# Patient Record
Sex: Male | Born: 1990 | Race: Black or African American | Hispanic: No | Marital: Single | Smoking: Former smoker
Health system: Southern US, Community
[De-identification: ages and names within clinical notes are randomized; demographics above are authoritative.]

## PROBLEM LIST (undated history)

## (undated) HISTORY — DX: Morbid (severe) obesity due to excess calories: E66.01

## (undated) HISTORY — PX: APPENDECTOMY: SHX54

---

## 2013-12-10 ENCOUNTER — Emergency Department (HOSPITAL_BASED_OUTPATIENT_CLINIC_OR_DEPARTMENT_OTHER)
Admission: EM | Admit: 2013-12-10 | Discharge: 2013-12-10 | Disposition: A | Discharge status: Home / Self Care | Payer: Self-pay | Attending: Emergency Medicine | Admitting: Emergency Medicine

## 2013-12-10 ENCOUNTER — Encounter (HOSPITAL_BASED_OUTPATIENT_CLINIC_OR_DEPARTMENT_OTHER): Payer: Self-pay | Admitting: Emergency Medicine

## 2013-12-10 DIAGNOSIS — R630 Anorexia: Secondary | ICD-10-CM | POA: Insufficient documentation

## 2013-12-10 DIAGNOSIS — J029 Acute pharyngitis, unspecified: Secondary | ICD-10-CM | POA: Insufficient documentation

## 2013-12-10 DIAGNOSIS — J111 Influenza due to unidentified influenza virus with other respiratory manifestations: Secondary | ICD-10-CM | POA: Insufficient documentation

## 2013-12-10 DIAGNOSIS — R05 Cough: Secondary | ICD-10-CM | POA: Insufficient documentation

## 2013-12-10 DIAGNOSIS — F17200 Nicotine dependence, unspecified, uncomplicated: Secondary | ICD-10-CM | POA: Insufficient documentation

## 2013-12-10 DIAGNOSIS — R6889 Other general symptoms and signs: Secondary | ICD-10-CM

## 2013-12-10 DIAGNOSIS — R079 Chest pain, unspecified: Secondary | ICD-10-CM | POA: Insufficient documentation

## 2013-12-10 DIAGNOSIS — IMO0001 Reserved for inherently not codable concepts without codable children: Secondary | ICD-10-CM | POA: Insufficient documentation

## 2013-12-10 DIAGNOSIS — R059 Cough, unspecified: Secondary | ICD-10-CM | POA: Insufficient documentation

## 2013-12-10 DIAGNOSIS — R509 Fever, unspecified: Secondary | ICD-10-CM | POA: Insufficient documentation

## 2013-12-10 MED ORDER — ACETAMINOPHEN 325 MG PO TABS
650.0000 mg | ORAL_TABLET | Freq: Once | ORAL | Status: AC
  Administered 2013-12-10: 650 mg via ORAL
  Filled 2013-12-10: qty 2

## 2013-12-10 NOTE — ED Provider Notes (Signed)
CSN: [id number]     Arrival date & time 12/10/13  2136 History   First MD Initiated Contact with Patient 12/10/13 2221     This chart was scribed for Shon Baton, MD by Ladona Ridgel Day, ED scribe. This patient was seen in room MH09/MH09 and the patient's care was started at 2221.  Chief Complaint  Patient presents with  . Fever   Patient is a 22 y.o. male presenting with cough. The history is provided by the patient. No language interpreter was used.  Cough Cough characteristics:  Harsh Severity:  Mild Onset quality:  Gradual Duration:  2 days Timing:  Constant Progression:  Worsening Chronicity:  New Context: sick contacts   Relieved by:  Nothing Worsened by:  Nothing tried Ineffective treatments:  None tried Associated symptoms: chest pain, fever and sore throat   Associated symptoms: no chills, no rash, no rhinorrhea and no shortness of breath    HPI Comments: Hunter Scott is a 22 y.o. male who presents to the Emergency Department complaining of constant, gradually worsened fever, myalgias and cough, onset 2 days ago. He also reports CP when coughing. He has not taken any medicines for this problem. He states girlfriend sick w/similar sx. He reports associated sore throat and decreased appetite. He denies SOB, abdominal pain, sore throat, nausea, emesis, diarrhea. He has no med hx. No allergies to medicines. Takes no regular medicines.  History reviewed. No pertinent past medical history. History reviewed. No pertinent past surgical history. History reviewed. No pertinent family history. History  Substance Use Topics  . Smoking status: Current Every Day Smoker  . Smokeless tobacco: Not on file  . Alcohol Use: Yes    Review of Systems  Constitutional: Positive for fever and appetite change. Negative for chills.  HENT: Positive for sore throat. Negative for congestion and rhinorrhea.   Respiratory: Positive for cough. Negative for shortness of breath.   Cardiovascular:  Positive for chest pain.  Gastrointestinal: Negative for nausea, vomiting, abdominal pain and diarrhea.  Musculoskeletal: Negative for back pain.  Skin: Negative for color change and rash.  Neurological: Negative for syncope.  All other systems reviewed and are negative.   A complete 10 system review of systems was obtained and all systems are negative except as noted in the HPI and PMH.   Allergies  Review of patient's allergies indicates no known allergies.  Home Medications  No current outpatient prescriptions on file. Triage Vitals: BP 145/66  Pulse 98  Temp(Src) 101.1 F (38.4 C) (Oral)  Resp 20  Ht 6\' 2"  (1.88 m)  Wt 210 lb (95.255 kg)  BMI 26.95 kg/m2  SpO2 98% Physical Exam  Nursing note and vitals reviewed. Constitutional: He is oriented to person, place, and time. No distress.  Ill-appearing but nontoxic  HENT:  Head: Normocephalic and atraumatic.  Mouth/Throat: Oropharynx is clear and moist.  Eyes: Pupils are equal, round, and reactive to light.  Neck: Neck supple.  Cardiovascular: Normal rate, regular rhythm and normal heart sounds.   No murmur heard. Pulmonary/Chest: Effort normal and breath sounds normal. No respiratory distress. He has no wheezes.  Abdominal: Soft. Bowel sounds are normal. There is no tenderness. There is no rebound.  Musculoskeletal: He exhibits no edema.  Lymphadenopathy:    He has no cervical adenopathy.  Neurological: He is alert and oriented to person, place, and time.  Skin: Skin is warm and dry. No rash noted.  Psychiatric: He has a normal mood and affect.    ED Course  Procedures (including critical care time) DIAGNOSTIC STUDIES: Oxygen Saturation is 98% on room air, normal by my interpretation.    COORDINATION OF CARE: At 1020 PM Discussed treatment plan with patient which includes tylenol. Patient agrees.   Labs Review Labs Reviewed - No data to display Imaging Review No results found.  EKG Interpretation   None        MDM   1. Fever   2. Flu-like symptoms     Patient presents with flulike symptoms. He is nontoxic-appearing on exam. Vital signs are notable for a fever to 101.1. Pulmonary exam is reassuring and have low suspicion for pneumonia at this time. Likely secondary to viral illness. Given duration of symptoms, even if this were the flu, patient would not be a candidate for Tamiflu. I have encouraged supportive care including Tylenol and Motrin as well as hydration. Patient stated understanding. At this time I do not feel he needs further workup.  After history, exam, and medical workup I feel the patient has been appropriately medically screened and is safe for discharge home. Pertinent diagnoses were discussed with the patient. Patient was given return precautions.   I personally performed the services described in this documentation, which was scribed in my presence. The recorded information has been reviewed and is accurate.      Shon Baton, MD 12/11/13 [phone]

## 2013-12-10 NOTE — ED Notes (Signed)
MD at bedside. 

## 2013-12-10 NOTE — ED Notes (Signed)
Patient with chills, body aches, and flu like symptoms since Monday.  Patient also has been coughing

## 2014-01-07 ENCOUNTER — Encounter (HOSPITAL_BASED_OUTPATIENT_CLINIC_OR_DEPARTMENT_OTHER): Payer: Self-pay | Admitting: Emergency Medicine

## 2014-01-07 ENCOUNTER — Emergency Department (HOSPITAL_BASED_OUTPATIENT_CLINIC_OR_DEPARTMENT_OTHER)
Admission: EM | Admit: 2014-01-07 | Discharge: 2014-01-07 | Disposition: A | Discharge status: Home / Self Care | Payer: Self-pay | Attending: Emergency Medicine | Admitting: Emergency Medicine

## 2014-01-07 DIAGNOSIS — F17200 Nicotine dependence, unspecified, uncomplicated: Secondary | ICD-10-CM | POA: Insufficient documentation

## 2014-01-07 DIAGNOSIS — J111 Influenza due to unidentified influenza virus with other respiratory manifestations: Secondary | ICD-10-CM | POA: Insufficient documentation

## 2014-01-07 DIAGNOSIS — J029 Acute pharyngitis, unspecified: Secondary | ICD-10-CM | POA: Insufficient documentation

## 2014-01-07 LAB — RAPID STREP SCREEN (MED CTR MEBANE ONLY): STREPTOCOCCUS, GROUP A SCREEN (DIRECT): POSITIVE — AB

## 2014-01-07 MED ORDER — HYDROCODONE-HOMATROPINE 5-1.5 MG/5ML PO SYRP: 5.0000 mL | ORAL_SOLUTION | Freq: Four times a day (QID) | ORAL | Status: AC | PRN

## 2014-01-07 NOTE — Discharge Instructions (Signed)
Influenza, Adult °Influenza (flu) is an infection in the mouth, nose, and throat (respiratory tract) caused by a virus. The flu can make you feel very ill. Influenza spreads easily from person to person (contagious).  °HOME CARE  °· Only take medicines as told by your doctor. °· Use a cool mist humidifier to make breathing easier. °· Get plenty of rest until your fever goes away. This usually takes 3 to 4 days. °· Drink enough fluids to keep your pee (urine) clear or pale yellow. °· Cover your mouth and nose when you cough or sneeze. °· Wash your hands well to avoid spreading the flu. °· Stay home from work or school until your fever has been gone for at least 1 full day. °· Get a flu shot every year. °GET HELP RIGHT AWAY IF:  °· You have trouble breathing or feel short of breath. °· Your skin or nails turn blue. °· You have severe neck pain or stiffness. °· You have a severe headache, facial pain, or earache. °· Your fever gets worse or keeps coming back. °· You feel sick to your stomach (nauseous), throw up (vomit), or have watery poop (diarrhea). °· You have chest pain. °· You have a deep cough that gets worse, or you cough up more thick spit (mucus). °MAKE SURE YOU:  °· Understand these instructions. °· Will watch your condition. °· Will get help right away if you are not doing well or get worse. °Document Released: 09/12/2008 Document Revised: 06/04/2012 Document Reviewed: 03/04/2012 °[employer]® Patient Information ©2014 [employer]. ° °

## 2014-01-07 NOTE — ED Provider Notes (Signed)
CSN: [id number]     Arrival date & time 01/07/14  0908 History   First MD Initiated Contact with Patient 01/07/14 [phone]     Chief Complaint  Patient presents with  . URI    sore throat, cough upper chest congestion    HPI Patient presents with fever cough and bodyaches since Friday.  Some congestion.  Slight sore throat.  No vomiting or diarrhea.  No previous past medical history. History reviewed. No pertinent past medical history. History reviewed. No pertinent past surgical history. History reviewed. No pertinent family history. History  Substance Use Topics  . Smoking status: Current Every Day Smoker  . Smokeless tobacco: Not on file  . Alcohol Use: Yes    Review of Systems All other systems reviewed and are negative Allergies  Review of patient's allergies indicates no known allergies.  Home Medications  No current outpatient prescriptions on file. BP 130/63  Pulse 102  Temp(Src) 100.3 F (37.9 C) (Oral)  Resp 18  SpO2 97% Physical Exam  Nursing note and vitals reviewed. Constitutional: He is oriented to person, place, and time. He appears well-developed and well-nourished. No distress.  HENT:  Head: Normocephalic and atraumatic.  Mouth/Throat: No oropharyngeal exudate.  Eyes: Pupils are equal, round, and reactive to light.  Neck: Normal range of motion.  Cardiovascular: Normal rate and intact distal pulses.   Pulmonary/Chest: No respiratory distress. He has no wheezes.  Abdominal: Normal appearance. He exhibits no distension. There is no tenderness. There is no rebound.  Musculoskeletal: Normal range of motion.  Neurological: He is alert and oriented to person, place, and time. No cranial nerve deficit.  Skin: Skin is warm and dry. No rash noted.  Psychiatric: He has a normal mood and affect. His behavior is normal.    ED Course  Procedures (including critical care time) Labs Review Labs Reviewed  RAPID STREP SCREEN   Imaging Review No results  found.    MDM   1. Influenza        Nelia Shiobert L Lyvonne Cassell, MD 01/07/14 [phone]

## 2014-01-07 NOTE — ED Notes (Signed)
Pt in with c/o cough, chest congestion, fever, aches, chills, and prior sore throat

## 2018-08-12 ENCOUNTER — Other Ambulatory Visit: Payer: Self-pay

## 2018-08-12 ENCOUNTER — Emergency Department (HOSPITAL_BASED_OUTPATIENT_CLINIC_OR_DEPARTMENT_OTHER)
Admission: EM | Admit: 2018-08-12 | Discharge: 2018-08-12 | Disposition: A | Discharge status: Home / Self Care | Payer: No Typology Code available for payment source | Attending: Emergency Medicine | Admitting: Emergency Medicine

## 2018-08-12 ENCOUNTER — Encounter (HOSPITAL_BASED_OUTPATIENT_CLINIC_OR_DEPARTMENT_OTHER): Payer: Self-pay | Admitting: *Deleted

## 2018-08-12 ENCOUNTER — Emergency Department (HOSPITAL_BASED_OUTPATIENT_CLINIC_OR_DEPARTMENT_OTHER): Payer: No Typology Code available for payment source

## 2018-08-12 DIAGNOSIS — S8001XA Contusion of right knee, initial encounter: Secondary | ICD-10-CM | POA: Diagnosis not present

## 2018-08-12 DIAGNOSIS — S30811A Abrasion of abdominal wall, initial encounter: Secondary | ICD-10-CM | POA: Insufficient documentation

## 2018-08-12 DIAGNOSIS — S161XXA Strain of muscle, fascia and tendon at neck level, initial encounter: Secondary | ICD-10-CM | POA: Diagnosis not present

## 2018-08-12 DIAGNOSIS — M545 Low back pain: Secondary | ICD-10-CM | POA: Diagnosis not present

## 2018-08-12 DIAGNOSIS — Y999 Unspecified external cause status: Secondary | ICD-10-CM | POA: Diagnosis not present

## 2018-08-12 DIAGNOSIS — Y9389 Activity, other specified: Secondary | ICD-10-CM | POA: Insufficient documentation

## 2018-08-12 DIAGNOSIS — Y92410 Unspecified street and highway as the place of occurrence of the external cause: Secondary | ICD-10-CM | POA: Insufficient documentation

## 2018-08-12 DIAGNOSIS — Z87891 Personal history of nicotine dependence: Secondary | ICD-10-CM | POA: Insufficient documentation

## 2018-08-12 DIAGNOSIS — M25562 Pain in left knee: Secondary | ICD-10-CM | POA: Diagnosis not present

## 2018-08-12 DIAGNOSIS — S199XXA Unspecified injury of neck, initial encounter: Secondary | ICD-10-CM | POA: Diagnosis present

## 2018-08-12 DIAGNOSIS — M79661 Pain in right lower leg: Secondary | ICD-10-CM | POA: Insufficient documentation

## 2018-08-12 MED ORDER — CYCLOBENZAPRINE HCL 10 MG PO TABS: 10.0000 mg | ORAL_TABLET | Freq: Two times a day (BID) | ORAL | 0 refills | Status: AC | PRN

## 2018-08-12 MED ORDER — IBUPROFEN 600 MG PO TABS: 600.0000 mg | ORAL_TABLET | Freq: Four times a day (QID) | ORAL | 0 refills | Status: DC | PRN
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
## 2018-08-12 NOTE — ED Notes (Signed)
ED Provider at bedside. 
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
## 2018-08-12 NOTE — ED Notes (Signed)
Patient transported to X-ray 

## 2018-09-21 ENCOUNTER — Encounter (INDEPENDENT_AMBULATORY_CARE_PROVIDER_SITE_OTHER): Payer: Self-pay
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
## 2019-09-29 ENCOUNTER — Other Ambulatory Visit: Payer: Self-pay

## 2019-09-29 ENCOUNTER — Encounter (HOSPITAL_BASED_OUTPATIENT_CLINIC_OR_DEPARTMENT_OTHER): Payer: Self-pay | Admitting: Emergency Medicine

## 2019-09-29 ENCOUNTER — Emergency Department (HOSPITAL_BASED_OUTPATIENT_CLINIC_OR_DEPARTMENT_OTHER)
Admission: EM | Admit: 2019-09-29 | Discharge: 2019-09-29 | Disposition: A | Discharge status: Home / Self Care | Payer: Self-pay | Attending: Emergency Medicine | Admitting: Emergency Medicine

## 2019-09-29 DIAGNOSIS — K029 Dental caries, unspecified: Secondary | ICD-10-CM | POA: Insufficient documentation

## 2019-09-29 DIAGNOSIS — F17200 Nicotine dependence, unspecified, uncomplicated: Secondary | ICD-10-CM | POA: Insufficient documentation

## 2019-09-29 DIAGNOSIS — K0889 Other specified disorders of teeth and supporting structures: Secondary | ICD-10-CM

## 2019-09-29 MED ORDER — IBUPROFEN 600 MG PO TABS: 600.0000 mg | ORAL_TABLET | Freq: Four times a day (QID) | ORAL | 0 refills | Status: DC | PRN

## 2019-09-29 MED ORDER — PENICILLIN V POTASSIUM 500 MG PO TABS: 500.0000 mg | ORAL_TABLET | Freq: Four times a day (QID) | ORAL | 0 refills | Status: AC

## 2019-09-29 MED FILL — PENICILLIN VK 500 MG TABLET: 500 | 7 days supply | Qty: 28 | Fill #0

## 2019-09-29 MED FILL — IBUPROFEN 600 MG TABLET: 600 | 7 days supply | Qty: 30 | Fill #0

## 2019-09-29 NOTE — Discharge Instructions (Signed)
Please keep appointment with dentist as scheduled for Thursday  Attached is a resource guide for other dentists in the area as well  Please take antibiotics as prescribed. Take Ibuprofen as prescribed as well. Do not exceed daily recommended dose of any OTC medications

## 2019-09-29 NOTE — ED Provider Notes (Signed)
[facility] EMERGENC[facility] DEPARTMENT Provider Note   CSN: [id number] Arrival date & time: 09/29/19  3235     History   Chief Complaint Chief Complaint  Patient presents with  . Dental Pain    HPI [facility] is a 28 y.o. male who presents to the ED today complaining of gradual onset, constant, achy, 10/10, right upper dental pain x 1 week. Pt reports he has been using OTC medications as well as applying oragel without relief. He states he has an appointment on Thursday for a dentist but came to the ED due to continued pain. Denies fever, chills, ear pain, facial swelling, drainage from tooth/foul taste in mouth, sore throat, trismus, inability to swallow, or any other associated symptoms.       History reviewed. No pertinent past medical history.  There are no active problems to display for this patient.   History reviewed. No pertinent surgical history.      Home Medications    Prior to Admission medications   Medication Sig Start Date End Date Taking? Authorizing Provider  cyclobenzaprine (FLEXERIL) 10 MG tablet Take 1 tablet (10 mg total) by mouth 2 (two) times daily as needed for muscle spasms. 08/12/18   McDonald, Mia A, PA-C  HYDROcodone-homatropine (HYCODAN) 5-1.5 MG/5ML syrup Take 5 mLs by mouth every 6 (six) hours as needed for cough. 01/07/14   Leonard Schwartz, MD  ibuprofen (ADVIL) 600 MG tablet Take 1 tablet (600 mg total) by mouth every 6 (six) hours as needed. 09/29/19   Alroy Bailiff, Ajna Moors, PA-C  ibuprofen (ADVIL,MOTRIN) 600 MG tablet Take 1 tablet (600 mg total) by mouth every 6 (six) hours as needed. 08/12/18   McDonald, Mia A, PA-C  penicillin v potassium (VEETID) 500 MG tablet Take 1 tablet (500 mg total) by mouth 4 (four) times daily for 7 days. 09/29/19 10/06/19  Eustaquio Maize, PA-C    Family History No family history on file.  Social History Social History   Tobacco Use  . Smoking status: Current Every Day Smoker  . Smokeless tobacco: Never  Used  . Tobacco comment: marijuana  Substance Use Topics  . Alcohol use: Yes  . Drug use: Yes    Types: Marijuana     Allergies   Patient has no known allergies.   Review of Systems Review of Systems  Constitutional: Negative for chills and fever.  HENT: Positive for dental problem. Negative for drooling, ear pain and facial swelling.      Physical Exam Updated Vital Signs BP 125/78   Pulse (!) 54   Temp 98.1 F (36.7 C) (Oral)   Resp 15   Ht 6\' 3"  (1.905 m)   Wt 104.3 kg   SpO2 97%   BMI 28.75 kg/m   Physical Exam Vitals signs and nursing note reviewed.  Constitutional:      Appearance: He is not ill-appearing or diaphoretic.  HENT:     Head: Normocephalic and atraumatic.     Right Ear: Tympanic membrane normal.     Left Ear: Tympanic membrane normal.     Mouth/Throat:     Comments: Nose clear.  R upper tooth #7 decayedwith caries with TTP, with minimal surrounding gingival swelling and erythema, no definite abscess, no evidence of ludwig's.  Oropharynx clear and moist, without uvular swelling or deviation, no trismus or drooling, no tonsillar swelling or erythema, no exudates.   Eyes:     Conjunctiva/sclera: Conjunctivae normal.  Neck:     Musculoskeletal: Normal range of motion.  Cardiovascular:     Rate and Rhythm: Normal rate and regular rhythm.     Pulses: Normal pulses.  Pulmonary:     Effort: Pulmonary effort is normal.     Breath sounds: Normal breath sounds. No wheezing, rhonchi or rales.  Lymphadenopathy:     Cervical: No cervical adenopathy.  Skin:    General: Skin is warm and dry.     Coloration: Skin is not jaundiced.  Neurological:     Mental Status: He is alert.      ED Treatments / Results  Labs (all labs ordered are listed, but only abnormal results are displayed) Labs Reviewed - No data to display  EKG None  Radiology No results found.  Procedures Procedures (including critical care time)  Medications Ordered in ED  Medications - No data to display   Initial Impression / Assessment and Plan / ED Course  I have reviewed the triage vital signs and the nursing notes.  Pertinent labs & imaging results that were available during my care of the patient were reviewed by me and considered in my medical decision making (see chart for details).    28 year old male presents to the ED with right upper dental pain x 1 week.  He states he has been taking over-the-counter medications without relief.  Exam he has dental caries throughout.  No obvious dental abscess to be drained today.  No Ludwig's angina.  No signs stable.  Patient afebrile without tachycardia or tachypnea.  Will treat with antibiotics today as well Ibuprofen.  She has an appointment with a dentist on Thursday.  I advised that he keep this for further evaluation.  Return precautions have been discussed.  Patient is in agreement with plan at this time and stable for discharge.   This note was prepared using Dragon voice recognition software and may include unintentional dictation errors due to the inherent limitations of voice recognition software.    Final Clinical Impressions(s) / ED Diagnoses   Final diagnoses:  Pain, dental  Dental caries    ED Discharge Orders         Ordered    penicillin v potassium (VEETID) 500 MG tablet  4 times daily     09/29/19 1050    ibuprofen (ADVIL) 600 MG tablet  Every 6 hours PRN     09/29/19 1050           Tanda Rockers, PA-C 09/29/19 1053    Gwyneth Sprout, MD 09/29/19 2157

## 2019-09-29 NOTE — ED Triage Notes (Signed)
Pt c/o pain to RT upper gum area x 1 wk

## 2019-10-09 ENCOUNTER — Other Ambulatory Visit: Payer: Self-pay

## 2019-10-09 DIAGNOSIS — Z20822 Contact with and (suspected) exposure to covid-19: Secondary | ICD-10-CM

## 2019-10-11 LAB — NOVEL CORONAVIRUS, NAA: SARS-CoV-2, NAA: NOT DETECTED

## 2020-01-31 ENCOUNTER — Other Ambulatory Visit: Payer: Self-pay

## 2020-01-31 ENCOUNTER — Emergency Department (HOSPITAL_BASED_OUTPATIENT_CLINIC_OR_DEPARTMENT_OTHER)
Admission: EM | Admit: 2020-01-31 | Discharge: 2020-01-31 | Disposition: A | Discharge status: Home / Self Care | Payer: HRSA Program | Attending: Emergency Medicine | Admitting: Emergency Medicine

## 2020-01-31 ENCOUNTER — Encounter (HOSPITAL_BASED_OUTPATIENT_CLINIC_OR_DEPARTMENT_OTHER): Payer: Self-pay | Admitting: Emergency Medicine

## 2020-01-31 ENCOUNTER — Emergency Department (HOSPITAL_BASED_OUTPATIENT_CLINIC_OR_DEPARTMENT_OTHER): Payer: HRSA Program

## 2020-01-31 DIAGNOSIS — R059 Cough, unspecified: Secondary | ICD-10-CM

## 2020-01-31 DIAGNOSIS — U071 COVID-19: Secondary | ICD-10-CM | POA: Insufficient documentation

## 2020-01-31 DIAGNOSIS — R05 Cough: Secondary | ICD-10-CM | POA: Diagnosis present

## 2020-01-31 DIAGNOSIS — F17210 Nicotine dependence, cigarettes, uncomplicated: Secondary | ICD-10-CM | POA: Diagnosis not present

## 2020-01-31 MED ORDER — IBUPROFEN 600 MG PO TABS
600.0000 mg | ORAL_TABLET | Freq: Four times a day (QID) | ORAL | 0 refills | Status: AC | PRN
Start: 2020-01-31 — End: ?

## 2020-01-31 MED ORDER — BENZONATATE 100 MG PO CAPS: 100.0000 mg | ORAL_CAPSULE | Freq: Three times a day (TID) | ORAL | 0 refills | Status: AC

## 2020-01-31 NOTE — ED Triage Notes (Signed)
Pt c/o fever that pt reports started last night. Pt also reports cough, and sinus congestion and itchy throat. Pt denies sob

## 2020-01-31 NOTE — ED Notes (Signed)
Portable Xray at bedside.

## 2020-01-31 NOTE — Discharge Instructions (Addendum)
You were seen in the emergency department today for a cough.  Your chest x-ray was normal.  We suspect this is related to a virus.  We are sending you home with ibuprofen to take every 6 hours as needed for pain or fevers, be sure to take this with food as it can cause stomach upset and or stomach bleeding.  We are also sending you with Tessalon take every hour as needed for coughing.  We have prescribed you new medication(s) today. Discuss the medications prescribed today with your pharmacist as they can have adverse effects and interactions with your other medicines including over the counter and prescribed medications. Seek medical evaluation if you start to experience new or abnormal symptoms after taking one of these medicines, seek care immediately if you start to experience difficulty breathing, feeling of your throat closing, facial swelling, or rash as these could be indications of a more serious allergic reaction  We have tested you for COVID 19, we will call you within the next 72 hours if results are positive, you may also view these results on MyChart.   We are instructing patient's with COVID 19 or symptoms of COVID 19 to quarantine themselves for 14 days. You may be able to discontinue self quarantine if the following conditions are met:   Persons with COVID-19 who have symptoms and were directed to care for themselves at home may discontinue home isolation under the  following conditions: - It has been at least 7 days have passed since symptoms first appeared. - AND at least 3 days (72 hours) have passed since recovery defined as resolution of fever without the use of fever-reducing medications and improvement in respiratory symptoms (e.g., cough, shortness of breath)  Please follow the below quarantine instructions.   Please follow up with primary care within 3-5 days for re-evaluation- call prior to going to the office to make them aware of your symptoms as some offices are altering  their method of seeing patients with COVID 19 symptoms. Return to the ER for new or worsening symptoms including but not limited to increased work of breathing, chest pain, passing out, or any other concerns.       Person Under Monitoring Name: [facility]  Location: [address] [place] [address]   Infection Prevention Recommendations for Individuals Confirmed to have, or Being Evaluated for, 2019 Novel Coronavirus (COVID-19) Infection Who Receive Care at Home  Individuals who are confirmed to have, or are being evaluated for, COVID-19 should follow the prevention steps below until a healthcare provider or local or state health department says they can return to normal activities.  Stay home except to get medical care You should restrict activities outside your home, except for getting medical care. Do not go to work, school, or public areas, and do not use public transportation or taxis.  Call ahead before visiting your doctor Before your medical appointment, call the healthcare provider and tell them that you have, or are being evaluated for, COVID-19 infection. This will help the healthcare provider's office take steps to keep other people from getting infected. Ask your healthcare provider to call the local or state health department.  Monitor your symptoms Seek prompt medical attention if your illness is worsening (e.g., difficulty breathing). Before going to your medical appointment, call the healthcare provider and tell them that you have, or are being evaluated for, COVID-19 infection. Ask your healthcare provider to call the local or state health department.  Wear a facemask  You should wear a facemask that covers your nose and mouth when you are in the same room with other people and when you visit a healthcare provider. People who live with or visit you should also wear a facemask while they are in the same room with you.  Separate yourself from other people  in your home As much as possible, you should stay in a different room from other people in your home. Also, you should use a separate bathroom, if available.  Avoid sharing household items You should not share dishes, drinking glasses, cups, eating utensils, towels, bedding, or other items with other people in your home. After using these items, you should wash them thoroughly with soap and water.  Cover your coughs and sneezes Cover your mouth and nose with a tissue when you cough or sneeze, or you can cough or sneeze into your sleeve. Throw used tissues in a lined trash can, and immediately wash your hands with soap and water for at least 20 seconds or use an alcohol-based hand rub.  Wash your [facility] your hands often and thoroughly with soap and water for at least 20 seconds. You can use an alcohol-based hand sanitizer if soap and water are not available and if your hands are not visibly dirty. Avoid touching your eyes, nose, and mouth with unwashed hands.   Prevention Steps for Caregivers and Household Members of Individuals Confirmed to have, or Being Evaluated for, COVID-19 Infection Being Cared for in the Home  If you live with, or provide care at home for, a person confirmed to have, or being evaluated for, COVID-19 infection please follow these guidelines to prevent infection:  Follow healthcare provider's instructions Make sure that you understand and can help the patient follow any healthcare provider instructions for all care.  Provide for the patient's basic needs You should help the patient with basic needs in the home and provide support for getting groceries, prescriptions, and other personal needs.  Monitor the patient's symptoms If they are getting sicker, call his or her medical provider and tell them that the patient has, or is being evaluated for, COVID-19 infection. This will help the healthcare provider's office take steps to keep other people from getting  infected. Ask the healthcare provider to call the local or state health department.  Limit the number of people who have contact with the patient If possible, have only one caregiver for the patient. Other household members should stay in another home or place of residence. If this is not possible, they should stay in another room, or be separated from the patient as much as possible. Use a separate bathroom, if available. Restrict visitors who do not have an essential need to be in the home.  Keep older adults, very young children, and other sick people away from the patient Keep older adults, very young children, and those who have compromised immune systems or chronic health conditions away from the patient. This includes people with chronic heart, lung, or kidney conditions, diabetes, and cancer.  Ensure good ventilation Make sure that shared spaces in the home have good air flow, such as from an air conditioner or an opened window, weather permitting.  Wash your hands often Wash your hands often and thoroughly with soap and water for at least 20 seconds. You can use an alcohol based hand sanitizer if soap and water are not available and if your hands are not visibly dirty. Avoid touching your eyes, nose, and mouth with unwashed  hands. Use disposable paper towels to dry your hands. If not available, use dedicated cloth towels and replace them when they become wet.  Wear a facemask and gloves Wear a disposable facemask at all times in the room and gloves when you touch or have contact with the patient's blood, body fluids, and/or secretions or excretions, such as sweat, saliva, sputum, nasal mucus, vomit, urine, or feces.  Ensure the mask fits over your nose and mouth tightly, and do not touch it during use. Throw out disposable facemasks and gloves after using them. Do not reuse. Wash your hands immediately after removing your facemask and gloves. If your personal clothing becomes  contaminated, carefully remove clothing and launder. Wash your hands after handling contaminated clothing. Place all used disposable facemasks, gloves, and other waste in a lined container before disposing them with other household waste. Remove gloves and wash your hands immediately after handling these items.  Do not share dishes, glasses, or other household items with the patient Avoid sharing household items. You should not share dishes, drinking glasses, cups, eating utensils, towels, bedding, or other items with a patient who is confirmed to have, or being evaluated for, COVID-19 infection. After the person uses these items, you should wash them thoroughly with soap and water.  Wash laundry thoroughly Immediately remove and wash clothes or bedding that have blood, body fluids, and/or secretions or excretions, such as sweat, saliva, sputum, nasal mucus, vomit, urine, or feces, on them. Wear gloves when handling laundry from the patient. Read and follow directions on labels of laundry or clothing items and detergent. In general, wash and dry with the warmest temperatures recommended on the label.  Clean all areas the individual has used often Clean all touchable surfaces, such as counters, tabletops, doorknobs, bathroom fixtures, toilets, phones, keyboards, tablets, and bedside tables, every day. Also, clean any surfaces that may have blood, body fluids, and/or secretions or excretions on them. Wear gloves when cleaning surfaces the patient has come in contact with. Use a diluted bleach solution (e.g., dilute bleach with 1 part bleach and 10 parts water) or a household disinfectant with a label that says EPA-registered for coronaviruses. To make a bleach solution at home, add 1 tablespoon of bleach to 1 quart (4 cups) of water. For a larger supply, add  cup of bleach to 1 gallon (16 cups) of water. Read labels of cleaning products and follow recommendations provided on product labels. Labels  contain instructions for safe and effective use of the cleaning product including precautions you should take when applying the product, such as wearing gloves or eye protection and making sure you have good ventilation during use of the product. Remove gloves and wash hands immediately after cleaning.  Monitor yourself for signs and symptoms of illness Caregivers and household members are considered close contacts, should monitor their health, and will be asked to limit movement outside of the home to the extent possible. Follow the monitoring steps for close contacts listed on the symptom monitoring form.   ? If you have additional questions, contact your local health department or call the epidemiologist on call at [phone] (available 24/7). ? This guidance is subject to change. For the most up-to-date guidance from [facility], please refer to their website: [email or web address]

## 2020-01-31 NOTE — ED Provider Notes (Signed)
[facility] Provider [facility]   CSN: [id number] Arrival date & time: 01/31/20  1337     History Chief Complaint  Patient presents with  . Cough    [facility] is a 29 y.o. male with a history of tobacco abuse & prior appendectomy who presents to the ED with complaints of dry cough with subjective fever and chills since last night.  No alleviating or aggravating factors.  No intervention prior to arrival.  No recent sick contacts with similar symptoms.  No known exposures to COVID-19.  Denies nasal congestion, ear pain, sore throat, neck stiffness, shortness of breath, chest pain, abdominal pain, or N/V/D.   HPI     History reviewed. No pertinent past medical history.  There are no problems to display for this patient.   Past Surgical History:  Procedure Laterality Date  . APPENDECTOMY         History reviewed. No pertinent family history.  Social History   Tobacco Use  . Smoking status: Current Every Day Smoker  . Smokeless tobacco: Never Used  . Tobacco comment: marijuana  Substance Use Topics  . Alcohol use: Not Currently  . Drug use: Yes    Types: Marijuana    Home Medications Prior to Admission medications   Medication Sig Start Date End Date Taking? Authorizing Provider  cyclobenzaprine (FLEXERIL) 10 MG tablet Take 1 tablet (10 mg total) by mouth 2 (two) times daily as needed for muscle spasms. 08/12/18   McDonald, Mia A, PA-C  HYDROcodone-homatropine (HYCODAN) 5-1.5 MG/5ML syrup Take 5 mLs by mouth every 6 (six) hours as needed for cough. 01/07/14   Leonard Schwartz, MD  ibuprofen (ADVIL) 600 MG tablet Take 1 tablet (600 mg total) by mouth every 6 (six) hours as needed. 09/29/19   Alroy Bailiff, Margaux, PA-C  ibuprofen (ADVIL,MOTRIN) 600 MG tablet Take 1 tablet (600 mg total) by mouth every 6 (six) hours as needed. 08/12/18   McDonald, Mia A, PA-C    Allergies    Patient has no known allergies.  Review of Systems   Review of Systems    Constitutional: Positive for chills and fever (subjective).  HENT: Negative for congestion, ear pain and sore throat.   Respiratory: Positive for cough. Negative for shortness of breath.   Cardiovascular: Negative for chest pain.  Gastrointestinal: Negative for abdominal pain, diarrhea, nausea and vomiting.  Genitourinary: Negative for dysuria.  Musculoskeletal: Negative for arthralgias and myalgias.    Physical Exam Updated Vital Signs BP (!) 115/99 (BP Location: Right Arm)   Pulse 94   Temp 98.2 F (36.8 C) (Oral)   Resp 20   Ht 6\' 3"  (1.905 m)   Wt 131.5 kg   SpO2 99%   BMI 36.25 kg/m   Physical Exam Vitals and nursing note reviewed.  Constitutional:      General: He is not in acute distress.    Appearance: He is well-developed. He is not toxic-appearing.  HENT:     Head: Normocephalic and atraumatic.     Right Ear: Ear canal normal. Tympanic membrane is not perforated, erythematous, retracted or bulging.     Left Ear: Ear canal normal. Tympanic membrane is not perforated, erythematous, retracted or bulging.     Ears:     Comments: No mastoid erythema/swellng/tenderness.     Nose:     Right Sinus: No maxillary sinus tenderness or frontal sinus tenderness.     Left Sinus: No maxillary sinus tenderness or frontal sinus tenderness.  Mouth/Throat:     Pharynx: Oropharynx is clear. Uvula midline. No oropharyngeal exudate or posterior oropharyngeal erythema.     Comments: Posterior oropharynx is symmetric appearing. Patient tolerating own secretions without difficulty. No trismus. No drooling. No hot potato voice. No swelling beneath the tongue, submandibular compartment is soft.  Eyes:     General:        Right eye: No discharge.        Left eye: No discharge.     Conjunctiva/sclera: Conjunctivae normal.  Cardiovascular:     Rate and Rhythm: Normal rate and regular rhythm.  Pulmonary:     Effort: Pulmonary effort is normal. No respiratory distress.     Breath  sounds: Normal breath sounds. No wheezing, rhonchi or rales.  Abdominal:     General: There is no distension.     Palpations: Abdomen is soft.     Tenderness: There is no abdominal tenderness. There is no guarding or rebound.  Musculoskeletal:     Cervical back: Neck supple. No rigidity.  Lymphadenopathy:     Cervical: No cervical adenopathy.  Skin:    General: Skin is warm and dry.     Findings: No rash.  Neurological:     Mental Status: He is alert.  Psychiatric:        Behavior: Behavior normal.     ED Results / Procedures / Treatments   Labs (all labs ordered are listed, but only abnormal results are displayed) Labs Reviewed - No data to display  EKG None  Radiology No results found.  Procedures Procedures (including critical care time)  Medications Ordered in ED Medications - No data to display  ED Course  I have reviewed the triage vital signs and the nursing notes.  Pertinent labs & imaging results that were available during my care of the patient were reviewed by me and considered in my medical decision making (see chart for details).    [facility] was evaluated in [facility] on 01/31/2020 for the symptoms described in the history of present illness. He/she was evaluated in the context of the global COVID-19 pandemic, which necessitated consideration that the patient might be at risk for infection with the SARS-CoV-2 virus that causes COVID-19. Institutional protocols and algorithms that pertain to the evaluation of patients at risk for COVID-19 are in a state of rapid change based on information released by regulatory bodies including the CDC and federal and state organizations. These policies and algorithms were followed during the patient's care in the ED.  MDM Rules/Calculators/A&P                      Patient presents with cough & subjective fever with chills. Patient is nontoxic appearing, in no apparent distress, vitals without significant  abnormality. Patient is afebrile in the ED, lungs are CTA, CXR  negative for infiltrate, doubt acute community acquired pneumonia. There is no wheezing or signs of respiratory distress. Sxs onset < 7 days, afebrile here, no sinus tenderness, doubt acute bacterial sinusitis. Centor score 0, doubt strep pharyngitis. No evidence of AOM on exam. No meningeal signs. Suspect viral etiology, will test for covid, discussed influenza testing- patient declined and would not want tamiflu therefore this was deferred, discussed quarantine, will treat supportively with ibuprofen & Tessalon. I discussed results, treatment plan, need for PCP follow-up, and return precautions with the patient. Provided opportunity for questions, patient confirmed understanding and is in agreement with plan.    Final Clinical Impression(s) /  ED Diagnoses Final diagnoses:  Cough    Rx / DC Orders ED Discharge Orders         Ordered    ibuprofen (ADVIL) 600 MG tablet  Every 6 hours PRN     01/31/20 1500    benzonatate (TESSALON) 100 MG capsule  Every 8 hours     01/31/20 1500           Manvir Thorson, Pleas Koch, PA-C 01/31/20 1501    Raeford Razor, MD 02/01/20 [phone]

## 2020-02-01 LAB — SARS CORONAVIRUS 2 (TAT 6-24 HRS): SARS Coronavirus 2: POSITIVE — AB

## 2020-02-02 ENCOUNTER — Telehealth: Payer: Self-pay | Admitting: *Deleted

## 2020-02-02 ENCOUNTER — Encounter: Payer: Self-pay | Admitting: Physician Assistant

## 2020-02-02 ENCOUNTER — Telehealth: Payer: Self-pay | Admitting: Physician Assistant

## 2020-02-02 ENCOUNTER — Telehealth (HOSPITAL_COMMUNITY): Payer: Self-pay

## 2020-02-02 ENCOUNTER — Telehealth: Payer: Self-pay

## 2020-02-02 ENCOUNTER — Telehealth: Payer: Self-pay | Admitting: Nurse Practitioner

## 2020-02-02 NOTE — Telephone Encounter (Signed)
Pt called to get assistance with his MyChart and how to get a copy of his test results. Pt advised, he voiced understanding.

## 2020-02-02 NOTE — Telephone Encounter (Signed)
Called to Discuss with patient about Covid symptoms and the use of bamlanivimab, a monoclonal antibody infusion for those with mild to moderate Covid symptoms and at a high risk of hospitalization.     Pt is qualified for this infusion at the [facility] due to co-morbid conditions and/or a member of an at-risk group.     Unable to reach pt  

## 2020-02-02 NOTE — Telephone Encounter (Signed)
Returned pt.'s call. Given COVID 19 results, verbalizes understanding. Will quarantine x 10 days from beginning of symptoms. Reviewed homes safety - will isolate from other family members. Wear a mask when around others. Good handwashing, wipe down shared surfaces in your home. Given contact number for antibody infusion information. Health dept. Notified.

## 2020-02-02 NOTE — Telephone Encounter (Signed)
Called to discuss with patient about Covid symptoms and the use of bamlanivimab or casirivimab/imdevimab, a monoclonal antibody infusion for those with mild to moderate Covid symptoms and at a high risk of hospitalization.  Pt is qualified for this infusion at the [facility] infusion center due to BMI>35 and immuno-compromised   Message left to call back  Cline Crock PA-C  [facility]

## 2020-02-02 NOTE — Telephone Encounter (Signed)
Pt called to get covid test results. Will have nurse call him back. ° °Hunter Scott ° ° ° °

## 2020-02-03 ENCOUNTER — Telehealth: Payer: Self-pay | Admitting: Physician Assistant

## 2020-02-03 ENCOUNTER — Telehealth (HOSPITAL_COMMUNITY): Payer: Self-pay

## 2020-02-03 NOTE — Telephone Encounter (Signed)
02/03/2020  29 year old male reached via telephone today.  There have been multiple attempts from our team to build to reach the patient.  Patient was finally reached by telephone today.  He reported that he had symptom onset on 01/30/2020.  He tested positive for Covid on 01/31/2020.  Assessment: 01/30/2020 - symptom onset - cough  01/30/2020 - fever started 01/31/2020 - ED eval  01/31/2020 - SarsCOV2 - positive   Patient would qualify for the monoclonal antibody infusion based off his BMI  Plan: Patient declines monoclonal antibody infusion at this time Patient declined the telephone number for follow-up with our office if he changes his mind  Elisha Headland, FNP

## 2020-02-03 NOTE — Telephone Encounter (Signed)
Called to discuss with Youlanda Roys about Covid symptoms and the use of bamlanivimab, a monoclonal antibody infusion for those with mild to moderate Covid symptoms and at a high risk of hospitalization.     Pt is qualified for this infusion at the [facility] infusion center due to co-morbid conditions and/or a member of an at-risk group.  Unable to reach pt over the phone despite multiple calls.

## 2020-12-23 ENCOUNTER — Emergency Department (HOSPITAL_BASED_OUTPATIENT_CLINIC_OR_DEPARTMENT_OTHER)
Admission: EM | Admit: 2020-12-23 | Discharge: 2020-12-23 | Disposition: A | Discharge status: Home / Self Care | Payer: HRSA Program | Attending: Emergency Medicine | Admitting: Emergency Medicine

## 2020-12-23 ENCOUNTER — Encounter (HOSPITAL_BASED_OUTPATIENT_CLINIC_OR_DEPARTMENT_OTHER): Payer: Self-pay | Admitting: *Deleted

## 2020-12-23 ENCOUNTER — Other Ambulatory Visit: Payer: Self-pay

## 2020-12-23 DIAGNOSIS — F17200 Nicotine dependence, unspecified, uncomplicated: Secondary | ICD-10-CM | POA: Diagnosis not present

## 2020-12-23 DIAGNOSIS — Z20822 Contact with and (suspected) exposure to covid-19: Secondary | ICD-10-CM

## 2020-12-23 DIAGNOSIS — B349 Viral infection, unspecified: Secondary | ICD-10-CM

## 2020-12-23 DIAGNOSIS — U071 COVID-19: Secondary | ICD-10-CM | POA: Diagnosis not present

## 2020-12-23 DIAGNOSIS — R509 Fever, unspecified: Secondary | ICD-10-CM | POA: Diagnosis present

## 2020-12-23 MED ORDER — ONDANSETRON 4 MG PO TBDP
4.0000 mg | ORAL_TABLET | Freq: Once | ORAL | Status: AC
  Administered 2020-12-23: 4 mg via ORAL
  Filled 2020-12-23: qty 1

## 2020-12-23 MED ORDER — ONDANSETRON 4 MG PO TBDP: 4.0000 mg | ORAL_TABLET | Freq: Three times a day (TID) | ORAL | 0 refills | Status: AC | PRN

## 2020-12-23 MED ORDER — ACETAMINOPHEN 325 MG PO TABS
650.0000 mg | ORAL_TABLET | Freq: Once | ORAL | Status: AC
  Administered 2020-12-23: 650 mg via ORAL
  Filled 2020-12-23: qty 2

## 2020-12-23 NOTE — ED Provider Notes (Signed)
[facility] EMERGENCY DEPARTMENT Provider Note   CSN: [id number] Arrival date & time: 12/23/20  1339     History Chief Complaint  Patient presents with  . Fever    Hunter Scott is a 30 y.o. male.  HPI 30 year old male with history of morbid obesity presents to the ER with complaints of chills, body aches, nausea and vomiting since about 3 AM.  He is not vaccinated for Covid.  No abdominal pain.  Has also lost his smell.  Overall feels very weak.  No diarrhea.  Does not think he had any bad food.  No cough, nasal congestion.  No shortness of breath or chest pain.  No back pain.    Past Medical History:  Diagnosis Date  . Morbid obesity (HCC)     There are no problems to display for this patient.   Past Surgical History:  Procedure Laterality Date  . APPENDECTOMY         No family history on file.  Social History   Tobacco Use  . Smoking status: Current Every Day Smoker  . Smokeless tobacco: Never Used  . Tobacco comment: marijuana  Vaping Use  . Vaping Use: Never used  Substance Use Topics  . Alcohol use: Not Currently  . Drug use: Yes    Types: Marijuana    Home Medications Prior to Admission medications   Medication Sig Start Date End Date Taking? Authorizing Provider  ondansetron (ZOFRAN ODT) 4 MG disintegrating tablet Take 1 tablet (4 mg total) by mouth every 8 (eight) hours as needed for nausea or vomiting. 12/23/20  Yes Mare Ferrari, PA-C  benzonatate (TESSALON) 100 MG capsule Take 1 capsule (100 mg total) by mouth every 8 (eight) hours. 01/31/20   Petrucelli, Samantha R, PA-C  cyclobenzaprine (FLEXERIL) 10 MG tablet Take 1 tablet (10 mg total) by mouth 2 (two) times daily as needed for muscle spasms. 08/12/18   McDonald, Mia A, PA-C  HYDROcodone-homatropine (HYCODAN) 5-1.5 MG/5ML syrup Take 5 mLs by mouth every 6 (six) hours as needed for cough. 01/07/14   Nelva Nay, MD  ibuprofen (ADVIL) 600 MG tablet Take 1 tablet (600 mg total) by mouth  every 6 (six) hours as needed for fever or mild pain. 01/31/20   Petrucelli, Pleas Koch, PA-C    Allergies    Patient has no known allergies.  Review of Systems   Review of Systems  Constitutional: Positive for chills. Negative for fever.  HENT: Negative for ear pain and sore throat.   Eyes: Negative for pain and visual disturbance.  Respiratory: Negative for cough and shortness of breath.   Cardiovascular: Negative for chest pain and palpitations.  Gastrointestinal: Positive for nausea and vomiting. Negative for abdominal pain.  Genitourinary: Negative for dysuria and hematuria.  Musculoskeletal: Negative for arthralgias and back pain.  Skin: Negative for color change and rash.  Neurological: Positive for weakness. Negative for seizures and syncope.  All other systems reviewed and are negative.   Physical Exam Updated Vital Signs BP (!) 111/55 (BP Location: Right Arm)   Pulse 90   Temp 100.3 F (37.9 C) (Oral)   Resp 16   Ht 6\' 3"  (1.905 m)   Wt 131.5 kg   SpO2 98%   BMI 36.25 kg/m   Physical Exam Constitutional:      General: He is not in acute distress.    Appearance: Normal appearance. He is not ill-appearing, toxic-appearing or diaphoretic.     Comments: Clinically well-appearing, nontoxic, no acute  distress  HENT:     Head: Normocephalic and atraumatic.  Eyes:     Extraocular Movements: Extraocular movements intact.     Conjunctiva/sclera: Conjunctivae normal.     Pupils: Pupils are equal, round, and reactive to light.  Cardiovascular:     Rate and Rhythm: Normal rate and regular rhythm.     Pulses: Normal pulses.     Heart sounds: Normal heart sounds.  Pulmonary:     Effort: Pulmonary effort is normal.     Breath sounds: Normal breath sounds.  Abdominal:     General: Abdomen is flat. Bowel sounds are normal. There is no distension.     Palpations: Abdomen is soft. There is no mass.     Tenderness: There is no abdominal tenderness. There is no right CVA  tenderness, left CVA tenderness, guarding or rebound.     Comments: Abdomen soft nontender, no flank tenderness  Musculoskeletal:        General: Normal range of motion.     Cervical back: Normal range of motion. No tenderness.  Skin:    General: Skin is warm and dry.  Neurological:     General: No focal deficit present.     Mental Status: He is alert and oriented to person, place, and time.  Psychiatric:        Mood and Affect: Mood normal.        Behavior: Behavior normal.     ED Results / Procedures / Treatments   Labs (all labs ordered are listed, but only abnormal results are displayed) Labs Reviewed  SARS CORONAVIRUS 2 (TAT 6-24 HRS)    EKG None  Radiology No results found.  Procedures Procedures (including critical care time)  Medications Ordered in ED Medications  acetaminophen (TYLENOL) tablet 650 mg (650 mg Oral Given 12/23/20 1720)  ondansetron (ZOFRAN-ODT) disintegrating tablet 4 mg (4 mg Oral Given 12/23/20 1759)    ED Course  I have reviewed the triage vital signs and the nursing notes.  Pertinent labs & imaging results that were available during my care of the patient were reviewed by me and considered in my medical decision making (see chart for details).    MDM Rules/Calculators/A&P                         Patient with body aches, nausea, vomiting.  Vitals on arrival with a fever of 100.9 which is treated with Tylenol.  No tachycardia, hypoxia.  Abdomen is soft and nontender, low suspicion for intra-abdominal process.  No flank tenderness on exam.  He is not vaccinated for Covid.  Patient was treated with Zofran here in the ED, tolerated oral fluids well.  Will discharge with Zofran, swab for Covid here.  Patient was instructed on supportive care, return precautions, quarantine.  If his test is positive.  He was instructed to follow-up via the MyChart app.  He was understanding and is agreeable.  At this stage in the ED course, the patient is medically  screened and stable for discharge.  Final Clinical Impression(s) / ED Diagnoses Final diagnoses:  Viral illness  Encounter for laboratory testing for COVID-19 virus    Rx / DC Orders ED Discharge Orders         Ordered    ondansetron (ZOFRAN ODT) 4 MG disintegrating tablet  Every 8 hours PRN        12/23/20 1759           Mare Ferrari, PA-C  12/23/20 [place], MD 12/24/20 2145

## 2020-12-23 NOTE — ED Notes (Addendum)
Pt here reports vomiting at 5am about 4 times with weakness.  Pt unable to eat today but has kept down fluids. Pt had COVID last year in August.  Has headache. Denies cough but has runny nose

## 2020-12-23 NOTE — ED Notes (Signed)
Remains in lobby waiting on ER bed, no acute distress

## 2020-12-23 NOTE — Discharge Instructions (Addendum)
Please read instructions below.  You can alternate Tylenol/acetaminophen and Advil/ibuprofen/Motrin every 4 hours for sore throat, body aches, headache or fever.  Take nausea medicine as needed. Drink plenty of water.  Use saline nasal spray for congestion. You can take Tessalon every 8 hours as needed for cough. Wash your hands frequently. You have a COVID test pending. Please isolate at home while awaiting your results.  You may follow-up with [place] via the MyChart app, there is information in your discharge paperwork on how to download this app. > If your test is negative, stay home until your fever has resolved/your symptoms are improving. > If your test is positive, isolate at home for at least 14 days after the day your symptoms initially began, and THEN at least 24 hours after you are fever-free without the help of medications AND your symptoms are improving.  > If your test is positive, the [facility] infusion clinic will contact you to discuss scheduling an infusion. Follow up with your primary care provider or the post-COVID care clinic at [facility]. Return to the ER for significant shortness of breath, uncontrollable vomiting, severe chest pain, or other concerning symptoms.

## 2020-12-23 NOTE — ED Triage Notes (Signed)
Fever, chills, body aches, and vomiting since 3 am. Headache and fatigue.

## 2020-12-24 LAB — SARS CORONAVIRUS 2 (TAT 6-24 HRS): SARS Coronavirus 2: POSITIVE — AB

## 2021-02-12 IMAGING — DX DG CHEST 1V PORT
1 series · 1 of 1 positions shown · non-contrast
Comparison: None.

CLINICAL DATA: Cough and fever since last night.

EXAM:
PORTABLE CHEST 1 VIEW

[chest ap]
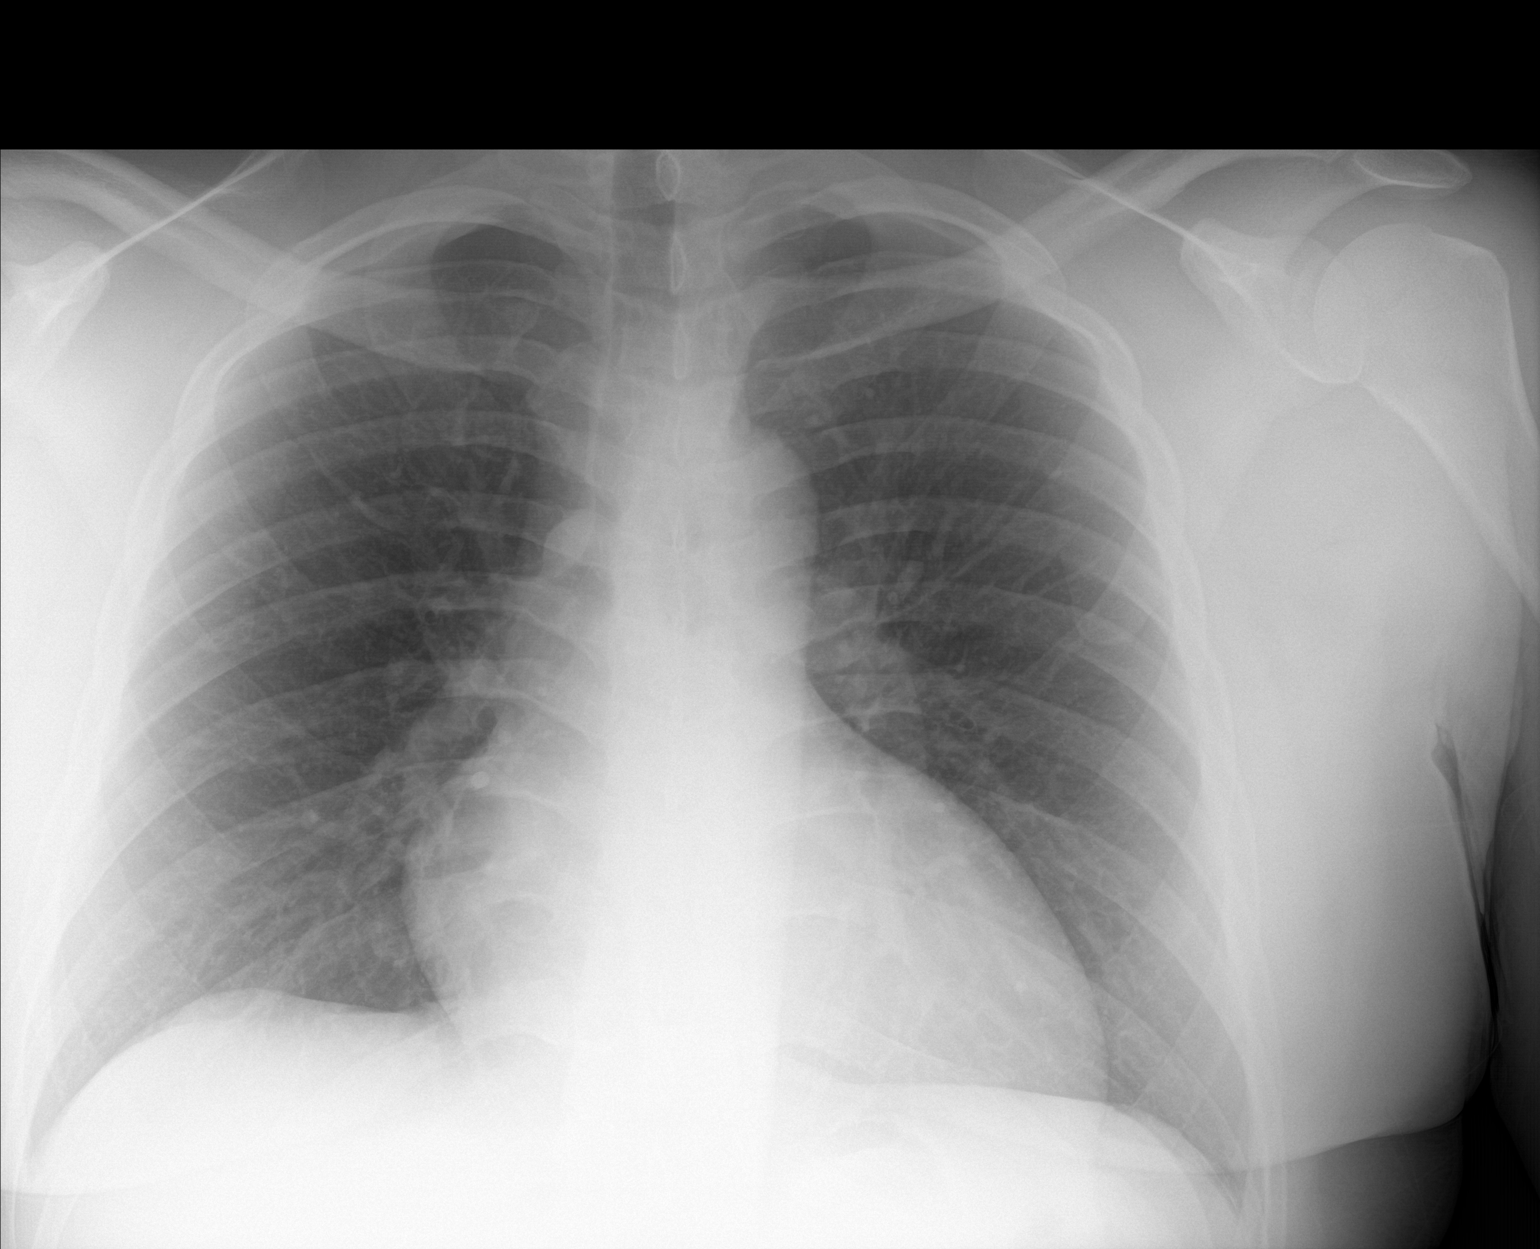

[1 of 1 positions shown; findings below may reference images not displayed]

FINDINGS: The heart size and mediastinal contours are within normal limits.
Both lungs are clear. The visualized skeletal structures are
unremarkable.
IMPRESSION: No active disease.

## 2021-07-05 ENCOUNTER — Other Ambulatory Visit: Payer: Self-pay

## 2021-07-05 ENCOUNTER — Encounter (HOSPITAL_BASED_OUTPATIENT_CLINIC_OR_DEPARTMENT_OTHER): Payer: Self-pay

## 2021-07-05 ENCOUNTER — Emergency Department (HOSPITAL_BASED_OUTPATIENT_CLINIC_OR_DEPARTMENT_OTHER)
Admission: EM | Admit: 2021-07-05 | Discharge: 2021-07-05 | Disposition: A | Discharge status: Home / Self Care | Payer: Self-pay | Attending: Emergency Medicine | Admitting: Emergency Medicine

## 2021-07-05 DIAGNOSIS — B349 Viral infection, unspecified: Secondary | ICD-10-CM | POA: Insufficient documentation

## 2021-07-05 DIAGNOSIS — Z87891 Personal history of nicotine dependence: Secondary | ICD-10-CM | POA: Insufficient documentation

## 2021-07-05 DIAGNOSIS — Z28310 Unvaccinated for covid-19: Secondary | ICD-10-CM | POA: Insufficient documentation

## 2021-07-05 DIAGNOSIS — Z20822 Contact with and (suspected) exposure to covid-19: Secondary | ICD-10-CM | POA: Insufficient documentation

## 2021-07-05 MED ORDER — ONDANSETRON HCL 4 MG PO TABS: 4.0000 mg | ORAL_TABLET | Freq: Four times a day (QID) | ORAL | 0 refills | Status: AC

## 2021-07-05 MED ORDER — ONDANSETRON 4 MG PO TBDP
8.0000 mg | ORAL_TABLET | Freq: Once | ORAL | Status: AC
  Administered 2021-07-05: 8 mg via ORAL
  Filled 2021-07-05: qty 2

## 2021-07-05 MED ORDER — LIDOCAINE VISCOUS HCL 2 % MT SOLN
15.0000 mL | Freq: Once | OROMUCOSAL | Status: AC
  Administered 2021-07-05: 15 mL via OROMUCOSAL
  Filled 2021-07-05: qty 15

## 2021-07-05 NOTE — ED Provider Notes (Signed)
[facility] EMERGENCY DEPARTMENT Provider Note   CSN: [id number] Arrival date & time: 07/05/21  2024     History Chief Complaint  Patient presents with   Generalized Body Aches    Hunter Scott is a 30 y.o. male.  Patient presents with sore throat, body aches, fevers, chills x1 day.  He has tried ibuprofen with minimal relief.  He is not COVID vaccinated.  Has had 2 possible COVID-positive exposures.  He is also feeling nauseated and has a few episodes of emesis associated when he tries to drink any fluids.  He has no abdominal pain.  The history is provided by the patient.      Past Medical History:  Diagnosis Date   Morbid obesity (HCC)     There are no problems to display for this patient.   Past Surgical History:  Procedure Laterality Date   APPENDECTOMY         No family history on file.  Social History   Tobacco Use   Smoking status: Former    Types: Cigarettes   Smokeless tobacco: Never   Tobacco comments:    marijuana  Vaping Use   Vaping Use: Never used  Substance Use Topics   Alcohol use: Yes    Comment: occ   Drug use: Not Currently    Types: Marijuana    Home Medications Prior to Admission medications   Medication Sig Start Date End Date Taking? Authorizing Provider  ondansetron (ZOFRAN) 4 MG tablet Take 1 tablet (4 mg total) by mouth every 6 (six) hours. 07/05/21  Yes Theron Arista, PA-C  benzonatate (TESSALON) 100 MG capsule Take 1 capsule (100 mg total) by mouth every 8 (eight) hours. 01/31/20   Petrucelli, Samantha R, PA-C  cyclobenzaprine (FLEXERIL) 10 MG tablet Take 1 tablet (10 mg total) by mouth 2 (two) times daily as needed for muscle spasms. 08/12/18   McDonald, Mia A, PA-C  HYDROcodone-homatropine (HYCODAN) 5-1.5 MG/5ML syrup Take 5 mLs by mouth every 6 (six) hours as needed for cough. 01/07/14   Nelva Nay, MD  ibuprofen (ADVIL) 600 MG tablet Take 1 tablet (600 mg total) by mouth every 6 (six) hours as needed for fever or  mild pain. 01/31/20   Petrucelli, Samantha R, PA-C  ondansetron (ZOFRAN ODT) 4 MG disintegrating tablet Take 1 tablet (4 mg total) by mouth every 8 (eight) hours as needed for nausea or vomiting. 12/23/20   Mare Ferrari, PA-C    Allergies    Patient has no known allergies.  Review of Systems   Review of Systems  All other systems reviewed and are negative. History is reviewed in the HPI, all pertinent positives and negatives are mentioned there.  Physical Exam Updated Vital Signs BP 133/63 (BP Location: Left Arm)   Pulse 93   Temp 98.7 F (37.1 C) (Oral)   Resp 18   Ht 6\' 3"  (1.905 m)   Wt 122.5 kg   SpO2 96%   BMI 33.75 kg/m   Physical Exam Vitals and nursing note reviewed. Exam conducted with a chaperone present.  Constitutional:      General: He is not in acute distress.    Appearance: Normal appearance.  HENT:     Head: Normocephalic and atraumatic.     Mouth/Throat:     Pharynx: Posterior oropharyngeal erythema present. No oropharyngeal exudate.     Comments: There is erythema to the throat without exudate.  Uvula is midline, airway is patent Eyes:     General:  No scleral icterus.    Extraocular Movements: Extraocular movements intact.     Pupils: Pupils are equal, round, and reactive to light.  Skin:    Coloration: Skin is not jaundiced.  Neurological:     Mental Status: He is alert. Mental status is at baseline.     Coordination: Coordination normal.    ED Results / Procedures / Treatments   Labs (all labs ordered are listed, but only abnormal results are displayed) Labs Reviewed  SARS CORONAVIRUS 2 (TAT 6-24 HRS)    EKG None  Radiology No results found.  Procedures Procedures   Medications Ordered in ED Medications  ondansetron (ZOFRAN-ODT) disintegrating tablet 8 mg (has no administration in time range)  lidocaine (XYLOCAINE) 2 % viscous mouth solution 15 mL (has no administration in time range)    ED Course  I have reviewed the triage  vital signs and the nursing notes.  Pertinent labs & imaging results that were available during my care of the patient were reviewed by me and considered in my medical decision making (see chart for details).    MDM Rules/Calculators/A&P                           Patient is stable vitals. I reviewed herI suspect viral pharyngitis with possible COVID involvement.  COVID swab obtained, symptomatic management recommended.  Patient agreeable to plan. Final Clinical Impression(s) / ED Diagnoses Final diagnoses:  Viral syndrome    Rx / DC Orders ED Discharge Orders          Ordered    ondansetron (ZOFRAN) 4 MG tablet  Every 6 hours        07/05/21 2142             Theron Arista, PA-C 07/06/21 0009    Virgina Norfolk, DO 07/06/21 1523

## 2021-07-05 NOTE — ED Triage Notes (Signed)
Pt c/o body aches, n/v started last night-NAD-steady gait

## 2021-07-05 NOTE — Discharge Instructions (Addendum)
COVID test will result within the next 24 hours, if you are positive you will see the call.  If positive please quarantine for 5 days, then you may return to work if you are feeling better but wear a mask for 5 more days. Take Zofran every 8 hours as needed for nausea and vomiting. Take Tylenol as needed for body aches. Continue to drink lots of fluids and stay hydrated. Virus may take about a week or 2 to totally resolve.  You can also take lozenges for sore throat.  If things worsen or change please return back to the ED for further evaluation.

## 2021-07-06 LAB — SARS CORONAVIRUS 2 (TAT 6-24 HRS): SARS Coronavirus 2: NEGATIVE

## 2022-09-27 ENCOUNTER — Emergency Department (HOSPITAL_BASED_OUTPATIENT_CLINIC_OR_DEPARTMENT_OTHER)
Admission: EM | Admit: 2022-09-27 | Discharge: 2022-09-27 | Disposition: A | Discharge status: Home / Self Care | Payer: Self-pay | Attending: Emergency Medicine | Admitting: Emergency Medicine

## 2022-09-27 ENCOUNTER — Encounter (HOSPITAL_BASED_OUTPATIENT_CLINIC_OR_DEPARTMENT_OTHER): Payer: Self-pay

## 2022-09-27 DIAGNOSIS — K029 Dental caries, unspecified: Secondary | ICD-10-CM | POA: Insufficient documentation

## 2022-09-27 DIAGNOSIS — K0889 Other specified disorders of teeth and supporting structures: Secondary | ICD-10-CM

## 2022-09-27 MED ORDER — ACETAMINOPHEN 500 MG PO TABS
1000.0000 mg | ORAL_TABLET | Freq: Once | ORAL | Status: AC
  Administered 2022-09-27: 1000 mg via ORAL
  Filled 2022-09-27: qty 2

## 2022-09-27 MED ORDER — PENICILLIN V POTASSIUM 500 MG PO TABS: 500.0000 mg | ORAL_TABLET | Freq: Four times a day (QID) | ORAL | 0 refills | Status: AC

## 2022-09-27 MED ORDER — KETOROLAC TROMETHAMINE 15 MG/ML IJ SOLN
15.0000 mg | Freq: Once | INTRAMUSCULAR | Status: AC
  Administered 2022-09-27: 15 mg via INTRAMUSCULAR
  Filled 2022-09-27: qty 1

## 2022-09-27 NOTE — ED Provider Notes (Signed)
[facility] EMERGENCY DEPARTMENT Provider Note   CSN: [id number] Arrival date & time: 09/27/22  4854     History Chief Complaint  Patient presents with   Dental Pain    HPI Hunter Scott is a 31 y.o. male presenting for dental pain.  He is an otherwise healthy 31 year old male denies fevers or chills, nausea vomiting, syncope shortness of breath.  He does endorse benefit from ice packs over the tooth.  It is left posterior inferior molar. Poor p.o. toleration.  Pain did get better with Tylenol prior to arrival.  No trismus or jaw limitations.   Patient's recorded medical, surgical, social, medication list and allergies were reviewed in the Snapshot window as part of the initial history.   Review of Systems   Review of Systems  Constitutional:  Negative for chills and fever.  HENT:  Positive for dental problem. Negative for ear pain and sore throat.   Eyes:  Negative for pain and visual disturbance.  Respiratory:  Negative for cough and shortness of breath.   Cardiovascular:  Negative for chest pain and palpitations.  Gastrointestinal:  Negative for abdominal pain and vomiting.  Genitourinary:  Negative for dysuria and hematuria.  Musculoskeletal:  Negative for arthralgias and back pain.  Skin:  Negative for color change and rash.  Neurological:  Negative for seizures and syncope.  All other systems reviewed and are negative.   Physical Exam Updated Vital Signs BP (!) 142/90 (BP Location: Right Arm)   Pulse (!) 51   Temp 98 F (36.7 C) (Oral)   Resp 16   Ht 6\' 3"  (1.905 m)   Wt 117.9 kg   SpO2 96%   BMI 32.50 kg/m  Physical Exam Vitals and nursing note reviewed.  Constitutional:      General: He is not in acute distress.    Appearance: He is well-developed.  HENT:     Head: Normocephalic and atraumatic.     Mouth/Throat:     Comments: Diffuse poor dentition and erosive dental caries. Eyes:     Conjunctiva/sclera: Conjunctivae normal.   Cardiovascular:     Rate and Rhythm: Normal rate and regular rhythm.     Heart sounds: No murmur heard. Pulmonary:     Effort: Pulmonary effort is normal. No respiratory distress.     Breath sounds: Normal breath sounds.  Abdominal:     Palpations: Abdomen is soft.     Tenderness: There is no abdominal tenderness.  Musculoskeletal:        General: No swelling.     Cervical back: Neck supple.  Skin:    General: Skin is warm and dry.     Capillary Refill: Capillary refill takes less than 2 seconds.  Neurological:     Mental Status: He is alert.  Psychiatric:        Mood and Affect: Mood normal.      ED Course/ Medical Decision Making/ A&P    Procedures Procedures   Medications Ordered in ED Medications  acetaminophen (TYLENOL) tablet 1,000 mg (has no administration in time range)  ketorolac (TORADOL) 15 MG/ML injection 15 mg (has no administration in time range)    Medical Decision Making:    Hunter Scott is a 31 y.o. male who presented to the ED today with dental pain detailed above.     Complete initial physical exam performed, notably the patient was HDS in NAD.      Reviewed and confirmed nursing documentation for past medical history, family history, social history.  Initial Assessment:   This is an acute complicated illness. With the patient's presentation of dental pain, most likely diagnosis is reversible versus irreversible pulpitis. Other diagnoses were considered including (but not limited to) dental abscess, Ludwick's, RPA, PTA. These are considered less likely due to history of present illness and physical exam findings.   Will empirically treat with penicillin, NSAIDs, anti-inflammatories and provided patient a resource list for local dental resources to follow-up in the outpatient setting.  No indication for further intervention in the emergency department and patient discharged in no acute distress.  Clinical Impression:  1. Pain, dental       Discharge   Final Clinical Impression(s) / ED Diagnoses Final diagnoses:  Pain, dental    Rx / DC Orders ED Discharge Orders          Ordered    penicillin v potassium (VEETID) 500 MG tablet  4 times daily        09/27/22 [id number]              Tretha Sciara, MD 09/27/22 [id number]

## 2022-09-27 NOTE — ED Triage Notes (Signed)
C/o left lower dental pain x 2 days. Does not have dentist.

## 2022-09-28 ENCOUNTER — Telehealth (HOSPITAL_BASED_OUTPATIENT_CLINIC_OR_DEPARTMENT_OTHER): Payer: Self-pay | Admitting: Emergency Medicine

## 2022-10-20 ENCOUNTER — Other Ambulatory Visit: Payer: Self-pay

## 2022-10-20 ENCOUNTER — Emergency Department (HOSPITAL_BASED_OUTPATIENT_CLINIC_OR_DEPARTMENT_OTHER): Payer: Self-pay

## 2022-10-20 ENCOUNTER — Encounter (HOSPITAL_BASED_OUTPATIENT_CLINIC_OR_DEPARTMENT_OTHER): Payer: Self-pay | Admitting: Urology

## 2022-10-20 ENCOUNTER — Emergency Department (HOSPITAL_BASED_OUTPATIENT_CLINIC_OR_DEPARTMENT_OTHER)
Admission: EM | Admit: 2022-10-20 | Discharge: 2022-10-20 | Disposition: A | Discharge status: Home / Self Care | Payer: Self-pay | Attending: Emergency Medicine | Admitting: Emergency Medicine

## 2022-10-20 DIAGNOSIS — M545 Low back pain, unspecified: Secondary | ICD-10-CM | POA: Diagnosis not present

## 2022-10-20 DIAGNOSIS — Y92410 Unspecified street and highway as the place of occurrence of the external cause: Secondary | ICD-10-CM | POA: Insufficient documentation

## 2022-10-20 DIAGNOSIS — M25561 Pain in right knee: Secondary | ICD-10-CM | POA: Insufficient documentation

## 2022-10-20 MED ORDER — ACETAMINOPHEN 325 MG PO TABS
650.0000 mg | ORAL_TABLET | Freq: Once | ORAL | Status: AC
  Administered 2022-10-20: 650 mg via ORAL
  Filled 2022-10-20: qty 2
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
## 2022-10-26 ENCOUNTER — Ambulatory Visit: Payer: Self-pay

## 2022-10-26 ENCOUNTER — Ambulatory Visit (INDEPENDENT_AMBULATORY_CARE_PROVIDER_SITE_OTHER): Payer: Self-pay | Admitting: Family Medicine

## 2022-10-26 VITALS — BP 130/90 | Ht 75.0 in | Wt 270.0 lb

## 2022-10-26 DIAGNOSIS — M25561 Pain in right knee: Secondary | ICD-10-CM

## 2022-10-26 DIAGNOSIS — S8001XA Contusion of right knee, initial encounter: Secondary | ICD-10-CM
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
## 2022-10-26 NOTE — Patient Instructions (Signed)
Nice to meet you  Please use ice as needed  I have made a referral to physical therapy  Please send me a message in MyChart with any questions or updates.  Please see me back in 6 weeks.   --Dr. Jordan Likes

## 2022-10-30 NOTE — Addendum Note (Signed)
Addended by: Merrilyn Puma on: 10/30/2022 11:48 AM   Modules accepted: Orders

## 2022-12-07 ENCOUNTER — Encounter: Payer: Self-pay | Admitting: Family Medicine

## 2022-12-07 ENCOUNTER — Ambulatory Visit (INDEPENDENT_AMBULATORY_CARE_PROVIDER_SITE_OTHER): Payer: Self-pay | Admitting: Family Medicine

## 2022-12-07 VITALS — BP 120/90 | Ht 75.0 in | Wt 250.0 lb

## 2022-12-07 DIAGNOSIS — S8001XD Contusion of right knee, subsequent encounter: Secondary | ICD-10-CM

## 2022-12-07 NOTE — Patient Instructions (Signed)
Good to see you Please use ice as needed  Please continue physical therapy   Please send me a message in MyChart with any questions or updates.  Please see me back in 4 weeks.   --Dr. Jordan Likes
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
## 2023-01-03 ENCOUNTER — Ambulatory Visit (INDEPENDENT_AMBULATORY_CARE_PROVIDER_SITE_OTHER): Payer: Worker's Compensation | Admitting: Family Medicine

## 2023-01-03 ENCOUNTER — Encounter: Payer: Self-pay | Admitting: Family Medicine

## 2023-01-03 VITALS — BP 144/98 | Ht 75.0 in | Wt 250.0 lb

## 2023-01-03 DIAGNOSIS — S8001XD Contusion of right knee, subsequent encounter: Secondary | ICD-10-CM

## 2023-01-03 NOTE — Patient Instructions (Signed)
Good to see you Please continue the exercises  Please use voltaren over the counter gel as needed   Please send me a message in MyChart with any questions or updates.  Please see me back in 4 weeks.   --Dr. Raeford Razor

## 2023-01-03 NOTE — Progress Notes (Signed)
  Hunter Scott - 32 y.o. male MRN [id number]  Date of birth: [birth year]  SUBJECTIVE:  Including CC & ROS.  No chief complaint on file.   Hunter Scott is a 32 y.o. male that is following up for his right knee pain following an injury at work.  He has been in physical therapy and getting stronger.  He does notice an altered sensation with the anterior aspect of the knee that occurs intermittently.  He does feel that he could do his normal responsibilities at work.    Review of Systems See HPI   HISTORY: Past Medical, Surgical, Social, and Family History Reviewed & Updated per EMR.   Pertinent Historical Findings include:  Past Medical History:  Diagnosis Date   Morbid obesity ([place])     Past Surgical History:  Procedure Laterality Date   APPENDECTOMY       PHYSICAL EXAM:  VS: BP (!) 144/98   Ht 6\' 3"  (1.905 m)   Wt 250 lb (113.4 kg)   BMI 31.25 kg/m  Physical Exam Gen: NAD, alert, cooperative with exam, well-appearing MSK:  Neurovascularly intact       ASSESSMENT & PLAN:   Contusion of right knee Doing well since his initial injury at work.  Notices an altered sensation in the front of the knee but has good strength and function. -Counseled on home exercise therapy and supportive care. -Provided work note. -Could consider further imaging.
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
## 2023-01-31 ENCOUNTER — Telehealth (INDEPENDENT_AMBULATORY_CARE_PROVIDER_SITE_OTHER): Payer: Self-pay | Admitting: Family Medicine

## 2023-01-31 ENCOUNTER — Encounter: Payer: Self-pay | Admitting: Family Medicine

## 2023-01-31 VITALS — Ht 75.0 in | Wt 250.0 lb

## 2023-01-31 DIAGNOSIS — S8001XD Contusion of right knee, subsequent encounter: Secondary | ICD-10-CM
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
## 2023-04-02 ENCOUNTER — Encounter: Payer: Self-pay | Admitting: *Deleted
